# Patient Record
Sex: Female | Born: 1980 | Race: White | Hispanic: No | Marital: Single | Smoking: Never smoker
Health system: Southern US, Community
[De-identification: ages and names within clinical notes are randomized; demographics above are authoritative.]

## PROBLEM LIST (undated history)

## (undated) HISTORY — PX: TUBAL LIGATION: SHX77

---

## 2005-02-23 ENCOUNTER — Emergency Department: Payer: Self-pay | Admitting: Emergency Medicine

## 2007-06-20 ENCOUNTER — Emergency Department: Payer: Self-pay | Admitting: Emergency Medicine

## 2009-02-08 ENCOUNTER — Emergency Department: Payer: Self-pay | Admitting: Unknown Physician Specialty

## 2011-06-18 IMAGING — CR DG ANKLE COMPLETE 3+V*L*
1 series · 5 of 5 positions shown · non-contrast
Comparison: none

REASON FOR EXAM: fall, swelling   WR
COMMENTS:

PROCEDURE:     DXR - DXR ANKLE LEFT COMPLETE  - February 08, 2009  [DATE]
RESULT:     There is no evidence of fracture, dislocation, or malalignment.

[Series 1: view not recorded · 0.17mm/px · 5 of 5 slices shown]
[im 1/5]
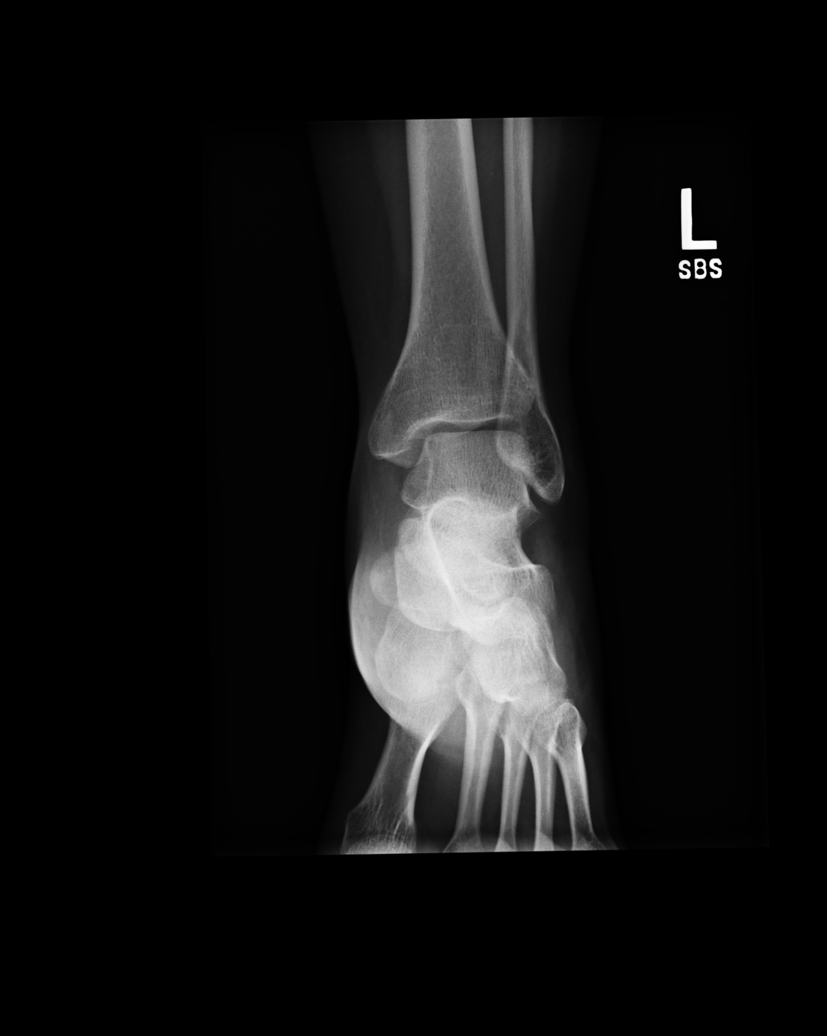
[im 2/5]
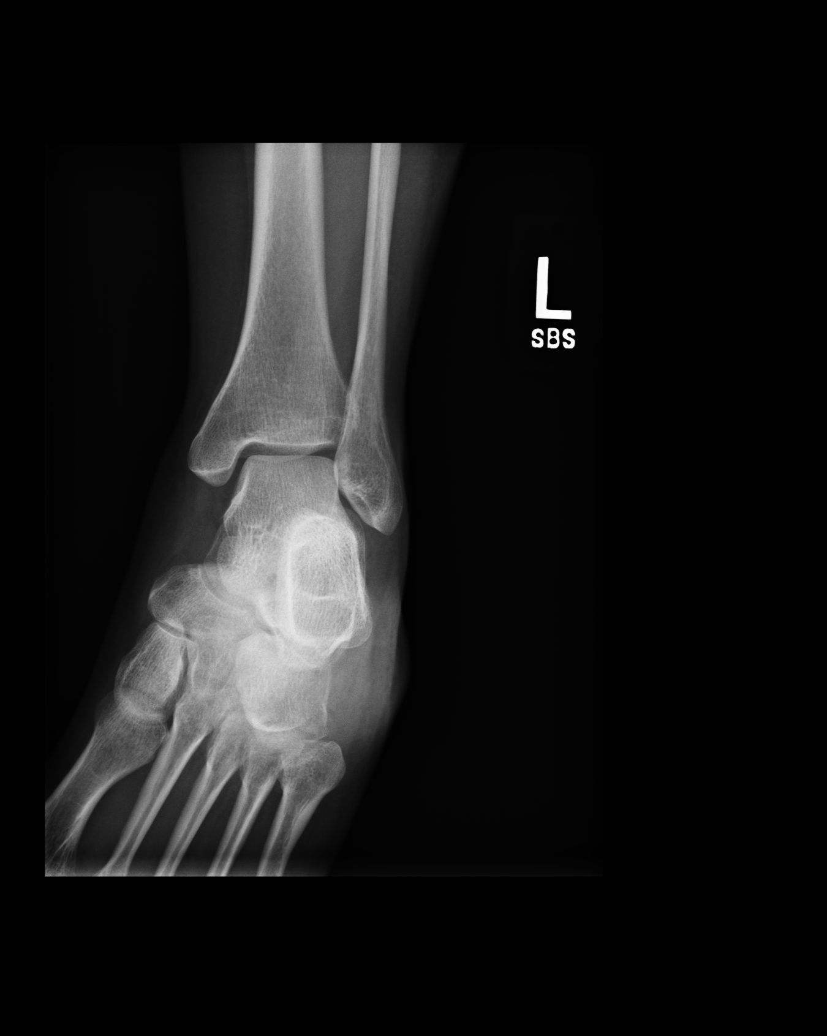
[im 3/5]
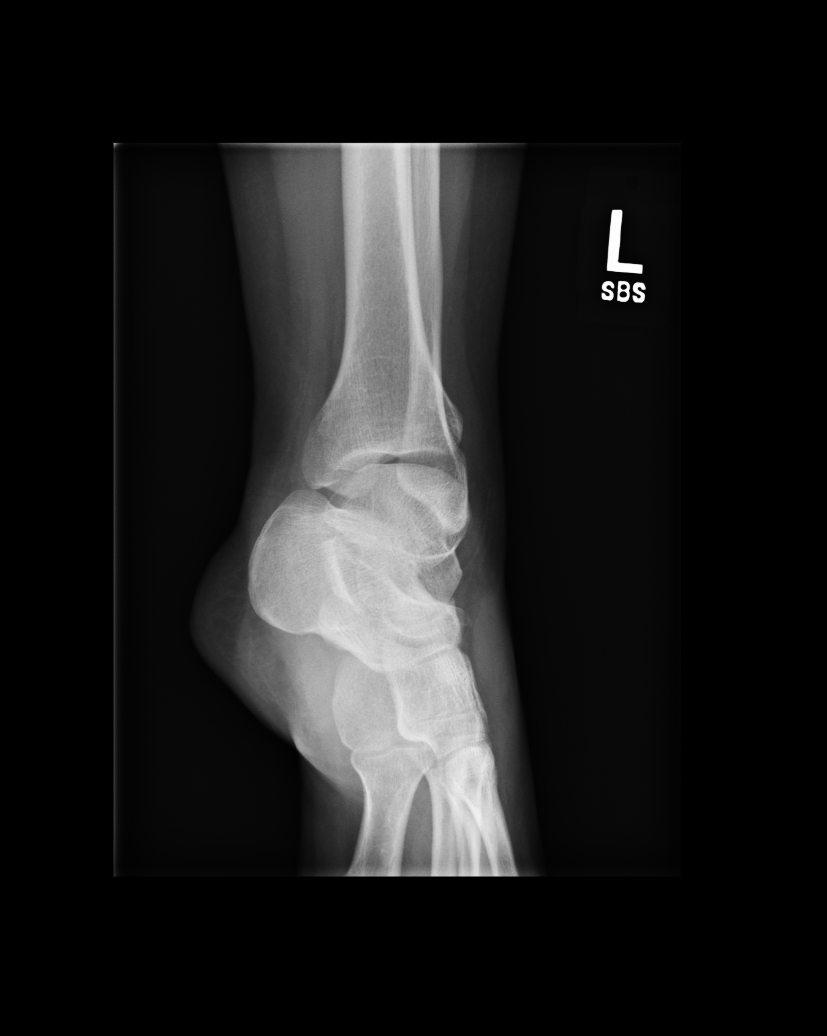
[im 4/5]
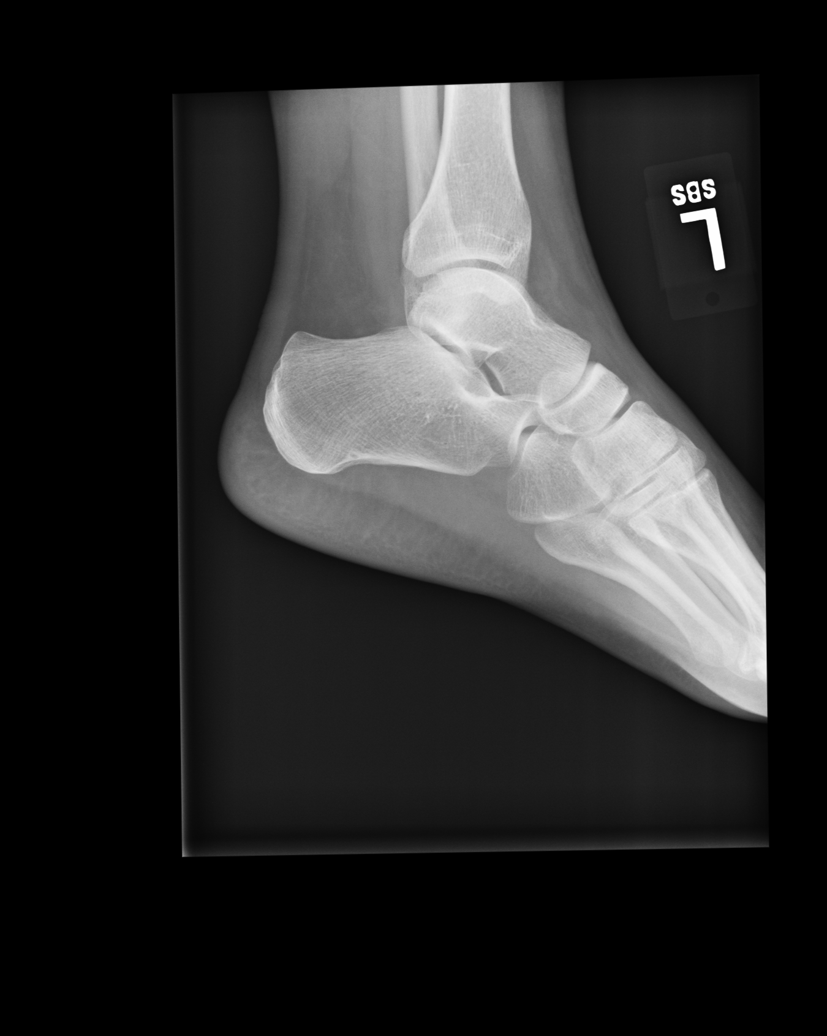
[im 5/5]
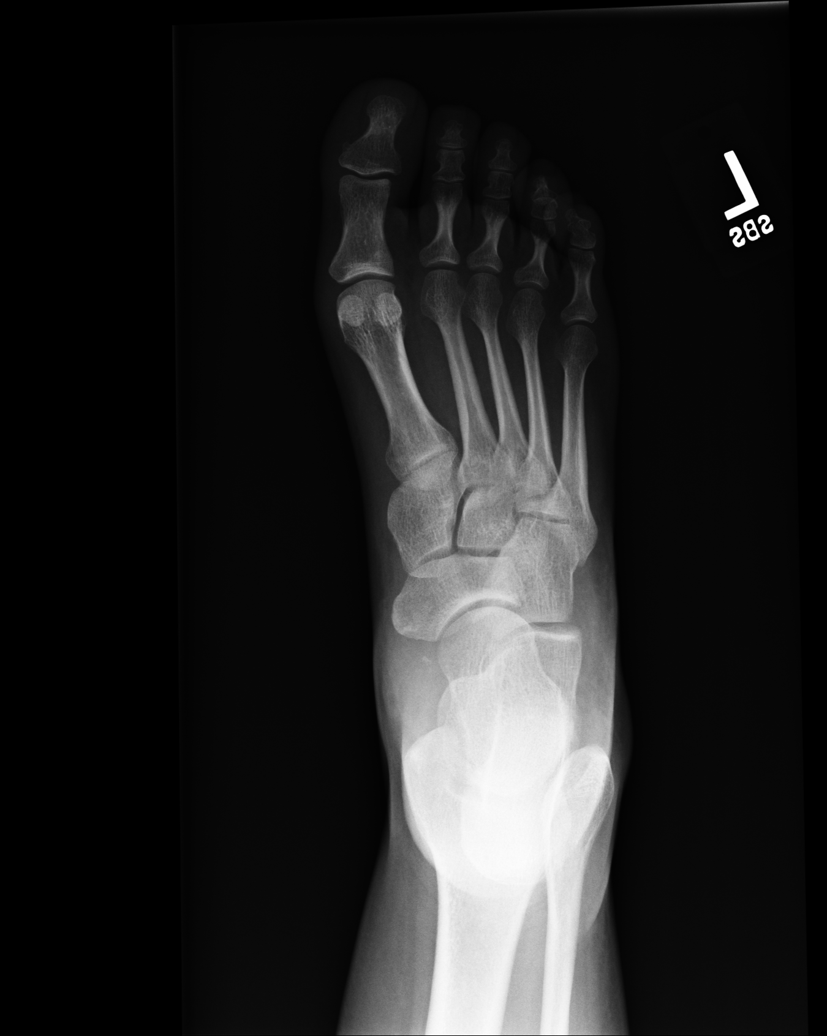

[5 of 5 positions shown; findings below may reference images not displayed]

IMPRESSION: 1. No evidence of acute abnormalities.
2. If there are persistent complaints of pain or persistent clinical
concern, a repeat evaluation in 7-10 days is recommended if clinically
warranted.

## 2019-01-07 ENCOUNTER — Other Ambulatory Visit: Payer: Self-pay

## 2019-01-07 ENCOUNTER — Ambulatory Visit
Admission: EM | Admit: 2019-01-07 | Discharge: 2019-01-07 | Disposition: A | Discharge status: Home / Self Care | Payer: Self-pay | Attending: Family Medicine | Admitting: Family Medicine

## 2019-01-07 ENCOUNTER — Encounter: Payer: Self-pay | Admitting: Emergency Medicine

## 2019-01-07 DIAGNOSIS — J020 Streptococcal pharyngitis: Secondary | ICD-10-CM

## 2019-01-07 LAB — RAPID STREP SCREEN (MED CTR MEBANE ONLY): Streptococcus, Group A Screen (Direct): POSITIVE — AB

## 2019-01-07 MED ORDER — AMOXICILLIN 875 MG PO TABS: 875.0000 mg | ORAL_TABLET | Freq: Two times a day (BID) | ORAL | 0 refills | Status: DC

## 2019-01-07 NOTE — ED Provider Notes (Signed)
[facility] ____________________________________________  Time seen: Approximately 3:31 PM  I have reviewed the triage vital signs and the nursing notes.   HISTORY  Chief Complaint Sore Throat   HPI Tina Floyd is a 38 y.o. female presenting for evaluation of sore throat present for the last 2 days.  States sore throat is painful to swallow but has overall continued to eat and drink well.  States her husband also had sore throat last week.  Denies cough, congestion, chest pain, shortness of breath, fevers, vomiting or diarrhea.  Denies other aggravating alleviating factors.  Denies other known sick contacts.   History reviewed. No pertinent past medical history.  There are no active problems to display for this patient.   Past Surgical History:  Procedure Laterality Date  . TUBAL LIGATION       No current facility-administered medications for this encounter.   Current Outpatient Medications:  .  amoxicillin (AMOXIL) 875 MG tablet, Take 1 tablet (875 mg total) by mouth 2 (two) times daily., Disp: 20 tablet, Rfl: 0  Allergies Patient has no known allergies.  History reviewed. No pertinent family history.  Social History Social History   Tobacco Use  . Smoking status: Never Smoker  . Smokeless tobacco: Never Used  Substance Use Topics  . Alcohol use: Never    Frequency: Never  . Drug use: Never    Review of Systems Constitutional: No fever ENT: Positive sore throat.  Negative congestion. Cardiovascular: Denies chest pain. Respiratory: Denies shortness of breath. Gastrointestinal: No abdominal pain.  No nausea, no vomiting.  No diarrhea.   Musculoskeletal: Negative for back pain. Skin: Negative for rash.   ____________________________________________   PHYSICAL EXAM:  VITAL SIGNS: ED Triage Vitals  Enc Vitals Group     BP 01/07/19 1120 117/76     Pulse Rate 01/07/19 1120 96     Resp 01/07/19 1120 18     Temp 01/07/19 1120 98.4 F  (36.9 C)     Temp Source 01/07/19 1120 Oral     SpO2 01/07/19 1120 98 %     Weight 01/07/19 1118 245 lb (111.1 kg)     Height 01/07/19 1118 5\' 2"  (1.575 m)     Head Circumference --      Peak Flow --      Pain Score 01/07/19 1117 7     Pain Loc --      Pain Edu? --      Excl. in GC? --     Constitutional: Alert and oriented. Well appearing and in no acute distress. Eyes: Conjunctivae are normal.  Head: Atraumatic. No sinus tenderness to palpation. No swelling. No erythema.  Ears: no erythema, normal TMs bilaterally.   Nose: No nasal congestion.  Mouth/Throat: Mucous membranes are moist. Mild to moderate pharyngeal erythema.  Mild bilateral tonsillar swelling.  Scant bilateral exudate.  No uvular shift or deviation. Neck: No stridor.  No cervical spine tenderness to palpation. Hematological/Lymphatic/Immunilogical: Monitor bilateral cervical lymphadenopathy. Cardiovascular: Normal rate, regular rhythm. Grossly normal heart sounds.  Good peripheral circulation. Respiratory: Normal respiratory effort.  No retractions. No wheezes, rales or rhonchi. Good air movement.  Musculoskeletal: Ambulatory with steady gait.  Neurologic:  Normal speech and language. No gait instability. Skin:  Skin appears warm, dry and intact. No rash noted. Psychiatric: Mood and affect are normal. Speech and behavior are normal.  ___________________________________________   LABS (all labs ordered are listed, but only abnormal results are displayed)  Labs Reviewed  RAPID STREP SCREEN (MED  CT[facility] ONLY) - Abnormal; Notable for the following components:      Result Value   Streptococcus, Group A Screen (Direct) POSITIVE (*)    All other components within normal limits  NOVEL CORONAVIRUS, NAA (HOSP ORDER, SEND-OUT TO REF LAB; TAT 18-24 HRS)   ____________________________________________    PROCEDURES Procedures    INITIAL IMPRESSION / ASSESSMENT AND PLAN / ED COURSE  Pertinent labs & imaging  results that were available during my care of the patient were reviewed by me and considered in my medical decision making (see chart for details).  Well-appearing patient.  No acute distress.  COVID-19 testing completed and advice given.  Strep positive.  Will treat with oral amoxicillin.  Encourage rest, fluids, supportive care.Discussed indication, risks and benefits of medications with patient.   Discussed follow up with Primary care physician this week as needed. Discussed follow up and return parameters including no resolution or any worsening concerns. Patient verbalized understanding and agreed to plan.   ____________________________________________   FINAL CLINICAL IMPRESSION(S) / ED DIAGNOSES  Final diagnoses:  Strep pharyngitis     ED Discharge Orders         Ordered    amoxicillin (AMOXIL) 875 MG tablet  2 times daily     01/07/19 1244           Note: This dictation was prepared with Dragon dictation along with smaller phrase technology. Any transcriptional errors that result from this process are unintentional.         Marylene Land, NP 01/07/19 1533

## 2019-01-07 NOTE — Discharge Instructions (Addendum)
Take medication as prescribed. Rest. Drink plenty of fluids.  ° °Follow up with your primary care physician this week as needed. Return to Urgent care for new or worsening concerns.  ° °

## 2019-01-07 NOTE — ED Triage Notes (Signed)
Patient c/o sore throat that started 2 days ago. Denies any other symptoms.

## 2019-01-08 LAB — NOVEL CORONAVIRUS, NAA (HOSP ORDER, SEND-OUT TO REF LAB; TAT 18-24 HRS): SARS-CoV-2, NAA: NOT DETECTED

## 2019-05-08 ENCOUNTER — Ambulatory Visit: Payer: Self-pay

## 2023-01-30 ENCOUNTER — Ambulatory Visit
Admission: EM | Admit: 2023-01-30 | Discharge: 2023-01-30 | Disposition: A | Discharge status: Home / Self Care | Payer: BLUE CROSS/BLUE SHIELD | Attending: Physician Assistant | Admitting: Physician Assistant

## 2023-01-30 ENCOUNTER — Encounter: Payer: Self-pay | Admitting: Emergency Medicine

## 2023-01-30 ENCOUNTER — Ambulatory Visit (INDEPENDENT_AMBULATORY_CARE_PROVIDER_SITE_OTHER): Payer: BLUE CROSS/BLUE SHIELD

## 2023-01-30 DIAGNOSIS — S9001XA Contusion of right ankle, initial encounter: Secondary | ICD-10-CM | POA: Diagnosis not present

## 2023-01-30 DIAGNOSIS — S93401A Sprain of unspecified ligament of right ankle, initial encounter: Secondary | ICD-10-CM

## 2023-01-30 DIAGNOSIS — M25571 Pain in right ankle and joints of right foot: Secondary | ICD-10-CM

## 2023-01-30 NOTE — Discharge Instructions (Signed)
SPRAIN: Stressed avoiding painful activities . Reviewed RICE guidelines. Use medications as directed, including NSAIDs. If no NSAIDs have been prescribed for you today, you may take Aleve or Motrin over the counter. May use Tylenol in between doses of NSAIDs.  If no improvement in the next 1-2 weeks, f/u with PCP or return to our office for reexamination, and please feel free to call or return at any time for any questions or concerns you may have and we will be happy to help you!      Send you message tomorrow with the results of your x-ray.  If you do have a fracture this will take a few weeks to heal and you should follow-up with orthopedics.  You have a condition requiring you to follow up with Orthopedics so please call one of the following office for appointment:   [facility] Address: [address] [place], [place] [address] Phone: [phone]  [facility] [address], [place], [place] [address] Phone: [phone]  [facility] [address], [place], [place] [address] Phone: [phone]

## 2023-01-30 NOTE — ED Triage Notes (Signed)
Patient states that she twisted her right ankle around 6 pm today.  Patient reports right ankle pain and swelling.

## 2023-01-30 NOTE — ED Provider Notes (Signed)
[facility]    CSN: [id number] Arrival date & time: 01/30/23  8295      History   Chief Complaint Chief Complaint  Patient presents with   Fall   Ankle Pain    right    HPI Tina Floyd is a 42 y.o. female presenting for pain and swelling of the right lateral ankle.  About 2 hours ago she reports twisting her ankle when stepping down.  She inverted the ankle.  Reports she was able to stand up and walk initially with use of a cane.  She is now limping quite a bit.  She has applied ice but not take anything for pain relief.  She broke a leg when she was young but does not remember which leg.  She has not had any numbness, weakness or tingling.  No other complaints or injuries.  HPI  History reviewed. No pertinent past medical history.  There are no active problems to display for this patient.   Past Surgical History:  Procedure Laterality Date   TUBAL LIGATION      OB History   No obstetric history on file.      Home Medications    Prior to Admission medications   Not on File    Family History History reviewed. No pertinent family history.  Social History Social History   Tobacco Use   Smoking status: Never   Smokeless tobacco: Never  Vaping Use   Vaping status: Never Used  Substance Use Topics   Alcohol use: Never   Drug use: Never     Allergies   Justicia adhatoda   Review of Systems Review of Systems  Musculoskeletal:  Positive for arthralgias, gait problem and joint swelling.  Skin:  Negative for color change and wound.  Neurological:  Negative for weakness and numbness.     Physical Exam Triage Vital Signs ED Triage Vitals  Encounter Vitals Group     BP      Systolic BP Percentile      Diastolic BP Percentile      Pulse      Resp      Temp      Temp src      SpO2      Weight      Height      Head Circumference      Peak Flow      Pain Score      Pain Loc      Pain Education      Exclude from Growth Chart     No data found.  Updated Vital Signs BP 123/72 (BP Location: Left Arm)   Pulse 88   Temp 98.6 F (37 C) (Oral)   Resp 14   Ht 5\' 2"  (1.575 m)   Wt 244 lb 14.9 oz (111.1 kg)   LMP 01/03/2023 (Approximate)   SpO2 98%   BMI 44.80 kg/m     Physical Exam Vitals and nursing note reviewed.  Constitutional:      General: She is not in acute distress.    Appearance: Normal appearance. She is not ill-appearing or toxic-appearing.  HENT:     Head: Normocephalic and atraumatic.  Eyes:     General: No scleral icterus.       Right eye: No discharge.        Left eye: No discharge.     Conjunctiva/sclera: Conjunctivae normal.  Cardiovascular:     Rate and Rhythm: Normal rate.  Pulses: Normal pulses.  Pulmonary:     Effort: Pulmonary effort is normal. No respiratory distress.  Musculoskeletal:     Cervical back: Neck supple.     Right ankle: Swelling and ecchymosis present. Tenderness present over the lateral malleolus and ATF ligament. Decreased range of motion.  Skin:    General: Skin is dry.  Neurological:     General: No focal deficit present.     Mental Status: She is alert. Mental status is at baseline.     Motor: No weakness.     Gait: Gait abnormal.  Psychiatric:        Mood and Affect: Mood normal.        Behavior: Behavior normal.      UC Treatments / Results  Labs (all labs ordered are listed, but only abnormal results are displayed) Labs Reviewed - No data to display  EKG   Radiology No results found.  Procedures Procedures (including critical care time)  Medications Ordered in UC Medications - No data to display  Initial Impression / Assessment and Plan / UC Course  I have reviewed the triage vital signs and the nursing notes.  Pertinent labs & imaging results that were available during my care of the patient were reviewed by me and considered in my medical decision making (see chart for details).   42 year old female presents for right ankle  pain, swelling and bruising after accidental fall and inversion injury today.  X-ray of right foot and ankle obtained assess for fracture.  Read negative.  Ankle sprain.  Patient given lace up ankle brace and crutches.  Reviewed RICE guidelines, use of Tylenol/ Motrin for pain control.  Reviewed return precautions.  X-ray overread is negative.   Final Clinical Impressions(s) / UC Diagnoses   Final diagnoses:  Pain in joint involving right ankle and foot  Contusion of right ankle, initial encounter  Sprain of right ankle, unspecified ligament, initial encounter     Discharge Instructions      SPRAIN: Stressed avoiding painful activities . Reviewed RICE guidelines. Use medications as directed, including NSAIDs. If no NSAIDs have been prescribed for you today, you may take Aleve or Motrin over the counter. May use Tylenol in between doses of NSAIDs.  If no improvement in the next 1-2 weeks, f/u with PCP or return to our office for reexamination, and please feel free to call or return at any time for any questions or concerns you may have and we will be happy to help you!      Send you message tomorrow with the results of your x-ray.  If you do have a fracture this will take a few weeks to heal and you should follow-up with orthopedics.  You have a condition requiring you to follow up with Orthopedics so please call one of the following office for appointment:   [facility] Address: [address], [place], [place] [address] Phone: [phone]  [facility] [address], [place], [place] [address] Phone: [phone]  [facility] [address] [place], [place] [address] Phone: [phone]      ED Prescriptions   None    PDMP not reviewed this encounter.   Shirlee Latch, PA-C 01/31/23 [phone]
